# Patient Record
Sex: Female | Born: 1975 | Race: Black or African American | Hispanic: No | Marital: Single | Smoking: Never smoker
Health system: Southern US, Community
[De-identification: ages and names within clinical notes are randomized; demographics above are authoritative.]

## PROBLEM LIST (undated history)

## (undated) DIAGNOSIS — K5010 Crohn's disease of large intestine without complications: Secondary | ICD-10-CM

## (undated) HISTORY — PX: BOWEL RESECTION: SHX1257

---

## 1998-08-19 ENCOUNTER — Other Ambulatory Visit: Admission: RE | Admit: 1998-08-19 | Discharge: 1998-08-19 | Payer: Self-pay | Admitting: Internal Medicine

## 1998-12-08 ENCOUNTER — Other Ambulatory Visit: Admission: RE | Admit: 1998-12-08 | Discharge: 1998-12-08 | Payer: Self-pay | Admitting: Internal Medicine

## 1999-08-19 ENCOUNTER — Emergency Department (HOSPITAL_COMMUNITY): Admission: EM | Admit: 1999-08-19 | Discharge: 1999-08-19 | Payer: Self-pay | Admitting: Emergency Medicine

## 1999-11-23 HISTORY — PX: TUBAL LIGATION: SHX77

## 1999-12-08 ENCOUNTER — Emergency Department (HOSPITAL_COMMUNITY): Admission: EM | Admit: 1999-12-08 | Discharge: 1999-12-08 | Payer: Self-pay | Admitting: Emergency Medicine

## 2000-02-16 ENCOUNTER — Ambulatory Visit (HOSPITAL_COMMUNITY): Admission: RE | Admit: 2000-02-16 | Discharge: 2000-02-16 | Payer: Self-pay | Admitting: *Deleted

## 2000-03-02 ENCOUNTER — Encounter: Admission: RE | Admit: 2000-03-02 | Discharge: 2000-03-02 | Payer: Self-pay | Admitting: Obstetrics & Gynecology

## 2000-03-16 ENCOUNTER — Encounter: Admission: RE | Admit: 2000-03-16 | Discharge: 2000-03-16 | Payer: Self-pay | Admitting: Obstetrics & Gynecology

## 2000-03-30 ENCOUNTER — Encounter: Admission: RE | Admit: 2000-03-30 | Discharge: 2000-03-30 | Payer: Self-pay | Admitting: Obstetrics & Gynecology

## 2000-04-06 ENCOUNTER — Ambulatory Visit (HOSPITAL_COMMUNITY): Admission: RE | Admit: 2000-04-06 | Discharge: 2000-04-06 | Payer: Self-pay | Admitting: Obstetrics & Gynecology

## 2000-04-13 ENCOUNTER — Encounter: Admission: RE | Admit: 2000-04-13 | Discharge: 2000-04-13 | Payer: Self-pay | Admitting: Obstetrics & Gynecology

## 2000-04-20 ENCOUNTER — Encounter: Admission: RE | Admit: 2000-04-20 | Discharge: 2000-04-20 | Payer: Self-pay | Admitting: Obstetrics & Gynecology

## 2000-04-22 ENCOUNTER — Inpatient Hospital Stay (HOSPITAL_COMMUNITY): Admission: AD | Admit: 2000-04-22 | Discharge: 2000-04-22 | Payer: Self-pay | Admitting: *Deleted

## 2000-04-27 ENCOUNTER — Ambulatory Visit (HOSPITAL_COMMUNITY): Admission: RE | Admit: 2000-04-27 | Discharge: 2000-04-27 | Payer: Self-pay | Admitting: Obstetrics & Gynecology

## 2000-05-03 ENCOUNTER — Inpatient Hospital Stay (HOSPITAL_COMMUNITY): Admission: AD | Admit: 2000-05-03 | Discharge: 2000-05-03 | Payer: Self-pay | Admitting: *Deleted

## 2000-05-11 ENCOUNTER — Encounter: Admission: RE | Admit: 2000-05-11 | Discharge: 2000-05-11 | Payer: Self-pay | Admitting: Obstetrics

## 2000-05-18 ENCOUNTER — Encounter: Admission: RE | Admit: 2000-05-18 | Discharge: 2000-05-18 | Payer: Self-pay | Admitting: Obstetrics

## 2000-05-18 ENCOUNTER — Ambulatory Visit (HOSPITAL_COMMUNITY): Admission: RE | Admit: 2000-05-18 | Discharge: 2000-05-18 | Payer: Self-pay | Admitting: Obstetrics & Gynecology

## 2000-06-01 ENCOUNTER — Encounter: Admission: RE | Admit: 2000-06-01 | Discharge: 2000-06-01 | Payer: Self-pay | Admitting: Obstetrics

## 2000-06-08 ENCOUNTER — Ambulatory Visit (HOSPITAL_COMMUNITY): Admission: RE | Admit: 2000-06-08 | Discharge: 2000-06-08 | Payer: Self-pay | Admitting: Obstetrics & Gynecology

## 2000-06-15 ENCOUNTER — Inpatient Hospital Stay (HOSPITAL_COMMUNITY): Admission: AD | Admit: 2000-06-15 | Discharge: 2000-06-15 | Payer: Self-pay | Admitting: *Deleted

## 2000-06-15 ENCOUNTER — Encounter: Payer: Self-pay | Admitting: *Deleted

## 2000-06-22 ENCOUNTER — Encounter: Admission: RE | Admit: 2000-06-22 | Discharge: 2000-06-22 | Payer: Self-pay | Admitting: Obstetrics

## 2000-06-28 ENCOUNTER — Encounter (HOSPITAL_COMMUNITY): Admission: RE | Admit: 2000-06-28 | Discharge: 2000-07-08 | Payer: Self-pay | Admitting: *Deleted

## 2000-07-07 ENCOUNTER — Inpatient Hospital Stay (HOSPITAL_COMMUNITY): Admission: AD | Admit: 2000-07-07 | Discharge: 2000-07-09 | Payer: Self-pay | Admitting: Obstetrics

## 2000-07-08 ENCOUNTER — Encounter (INDEPENDENT_AMBULATORY_CARE_PROVIDER_SITE_OTHER): Payer: Self-pay

## 2001-06-29 ENCOUNTER — Other Ambulatory Visit: Admission: RE | Admit: 2001-06-29 | Discharge: 2001-06-29 | Payer: Self-pay | Admitting: Obstetrics and Gynecology

## 2002-04-04 ENCOUNTER — Other Ambulatory Visit: Admission: RE | Admit: 2002-04-04 | Discharge: 2002-04-04 | Payer: Self-pay | Admitting: Obstetrics & Gynecology

## 2004-09-21 ENCOUNTER — Emergency Department (HOSPITAL_COMMUNITY): Admission: EM | Admit: 2004-09-21 | Discharge: 2004-09-21 | Payer: Self-pay | Admitting: Family Medicine

## 2006-08-26 ENCOUNTER — Encounter: Admission: RE | Admit: 2006-08-26 | Discharge: 2006-08-26 | Payer: Self-pay | Admitting: Nephrology

## 2007-01-09 ENCOUNTER — Emergency Department (HOSPITAL_COMMUNITY): Admission: EM | Admit: 2007-01-09 | Discharge: 2007-01-09 | Payer: Self-pay | Admitting: Emergency Medicine

## 2007-02-19 ENCOUNTER — Emergency Department (HOSPITAL_COMMUNITY): Admission: EM | Admit: 2007-02-19 | Discharge: 2007-02-19 | Payer: Self-pay | Admitting: Family Medicine

## 2008-01-03 ENCOUNTER — Emergency Department (HOSPITAL_COMMUNITY): Admission: EM | Admit: 2008-01-03 | Discharge: 2008-01-03 | Payer: Self-pay | Admitting: Family Medicine

## 2008-03-15 ENCOUNTER — Emergency Department (HOSPITAL_COMMUNITY): Admission: EM | Admit: 2008-03-15 | Discharge: 2008-03-15 | Payer: Self-pay | Admitting: Family Medicine

## 2008-04-10 ENCOUNTER — Encounter: Admission: RE | Admit: 2008-04-10 | Discharge: 2008-04-10 | Payer: Self-pay | Admitting: Family Medicine

## 2008-05-15 ENCOUNTER — Encounter (INDEPENDENT_AMBULATORY_CARE_PROVIDER_SITE_OTHER): Payer: Self-pay | Admitting: *Deleted

## 2008-05-15 ENCOUNTER — Ambulatory Visit (HOSPITAL_COMMUNITY): Admission: RE | Admit: 2008-05-15 | Discharge: 2008-05-15 | Payer: Self-pay | Admitting: *Deleted

## 2009-01-23 ENCOUNTER — Emergency Department (HOSPITAL_COMMUNITY): Admission: EM | Admit: 2009-01-23 | Discharge: 2009-01-23 | Payer: Self-pay | Admitting: Family Medicine

## 2009-03-04 ENCOUNTER — Other Ambulatory Visit: Admission: RE | Admit: 2009-03-04 | Discharge: 2009-03-04 | Payer: Self-pay | Admitting: Pediatrics

## 2010-08-26 ENCOUNTER — Encounter: Admission: RE | Admit: 2010-08-26 | Discharge: 2010-08-26 | Payer: Self-pay | Admitting: Gastroenterology

## 2011-01-15 IMAGING — CT CT ENTEROGRAPHY (ABD-PELV W/ CM)
2 of 5 series · 12 of 36 positions shown, 19 images · IV contrast (agent unspecified)
Comparison: None.

CLINICAL DATA: History of Crohn's disease, recent abdominal
discomfort with bloating and nausea, history of prior bowel
resection in 3002 with appendectomy.

CT ABDOMEN AND PELVIS WITH CONTRAST (CT ENTEROGRAPHY)
TECHNIQUE: Multidetector CT of the abdomen and pelvis during bolus
administration of intravenous contrast. Negative oral contrast
VoLumen was given.
Contrast: 100 ml 2mnipaque-KEE intravenously and 6570 ml VoLumen
orally.

[Series 3: enterography (id) · axial · 0.74mm/px · z∈[-401,-18]mm · 11 of 183 slices shown, 17 images]
[im 16/183  soft-tissue]
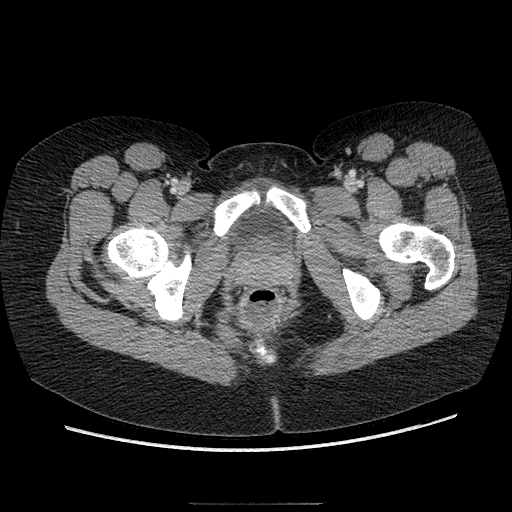
[im 16/183  bone]
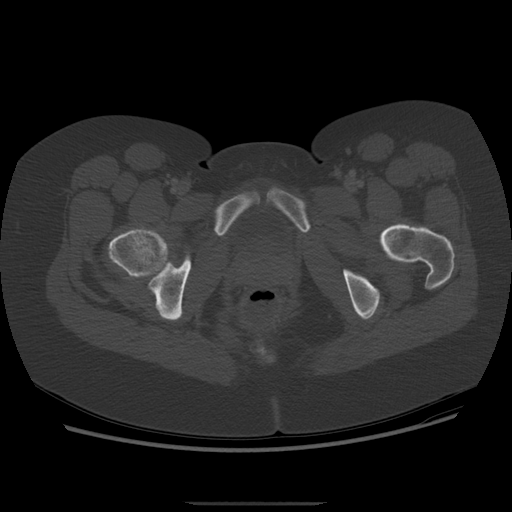
[im 31/183  soft-tissue]
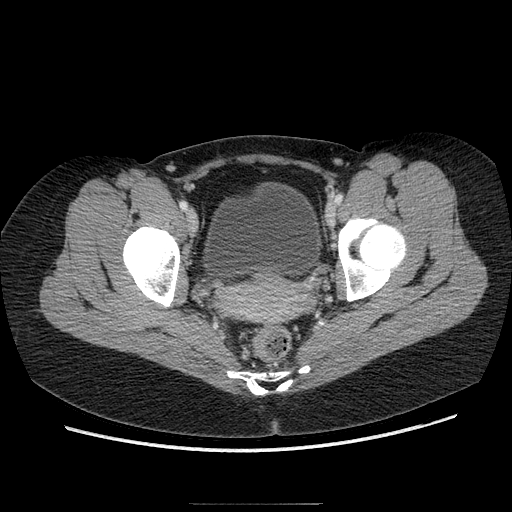
[im 46/183  soft-tissue]
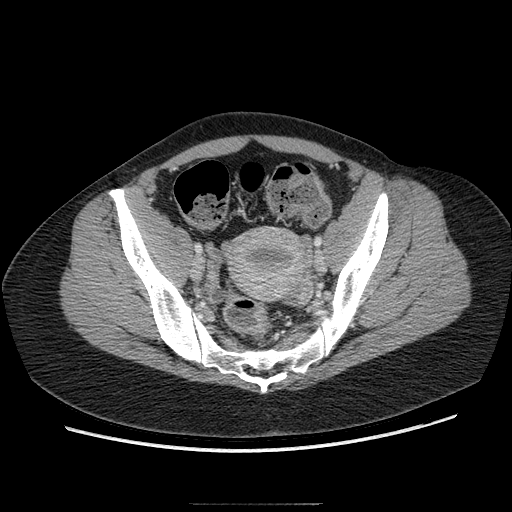
[im 61/183  soft-tissue]
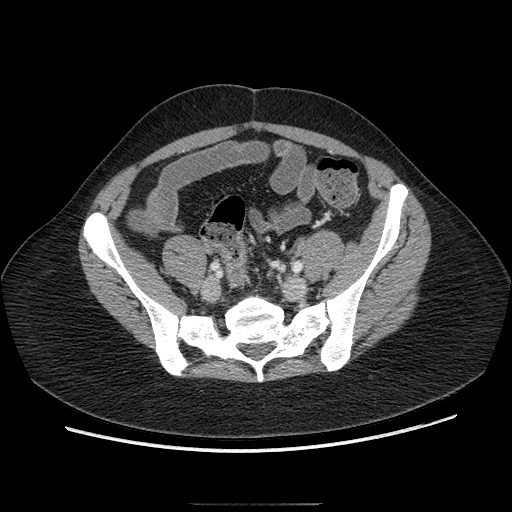
[im 76/183  soft-tissue]
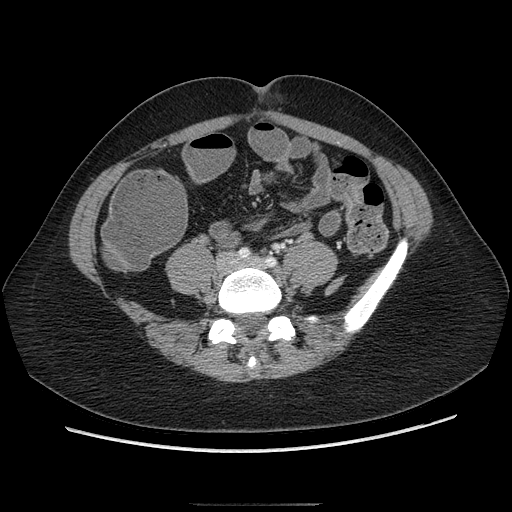
[im 92/183  soft-tissue]
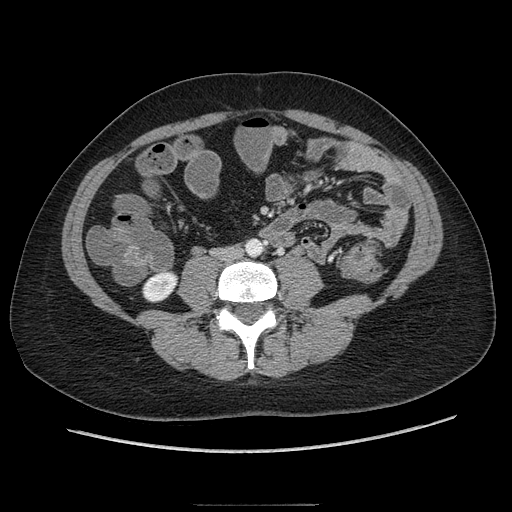
[im 107/183  soft-tissue]
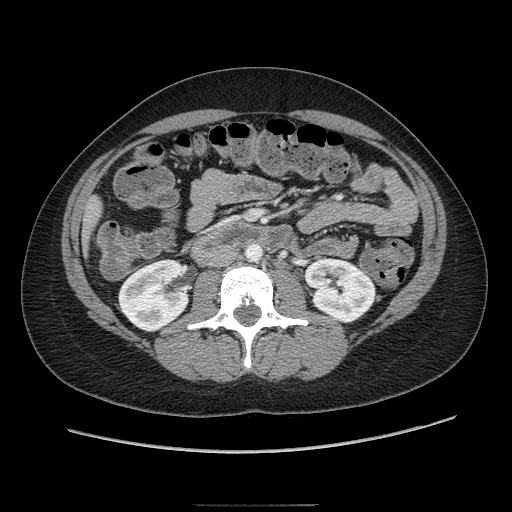
[im 122/183  soft-tissue]
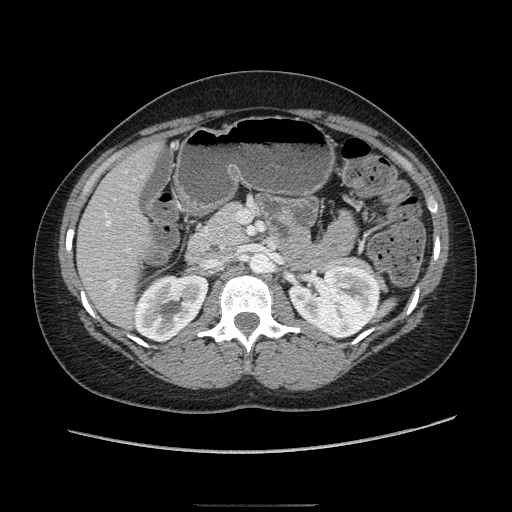
[im 122/183  lung]
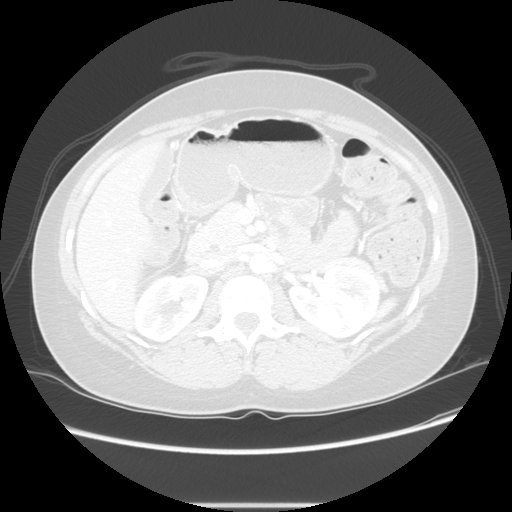
[im 137/183  soft-tissue]
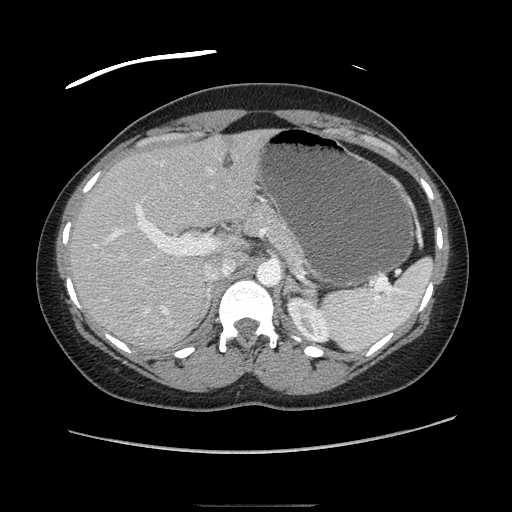
[im 137/183  lung]
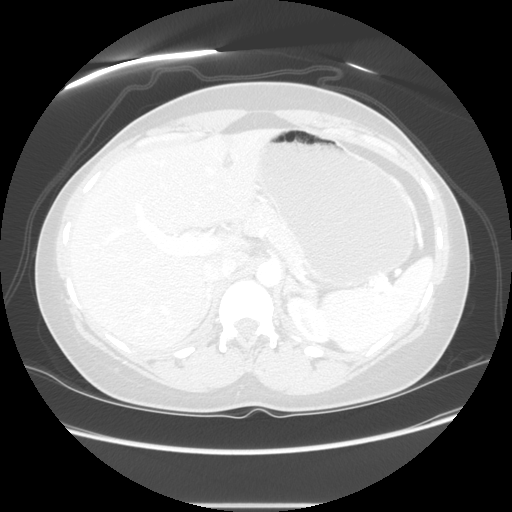
[im 137/183  bone]
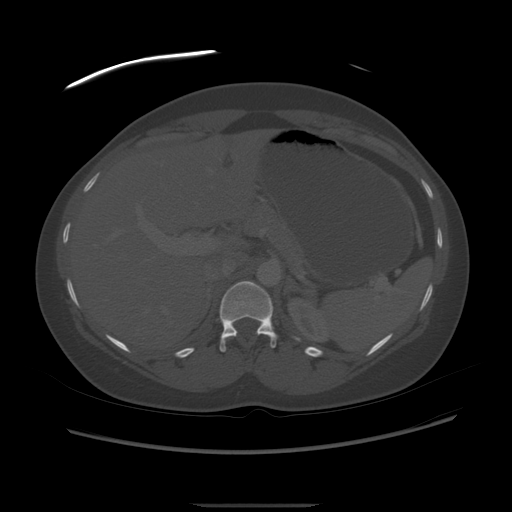
[im 152/183  soft-tissue]
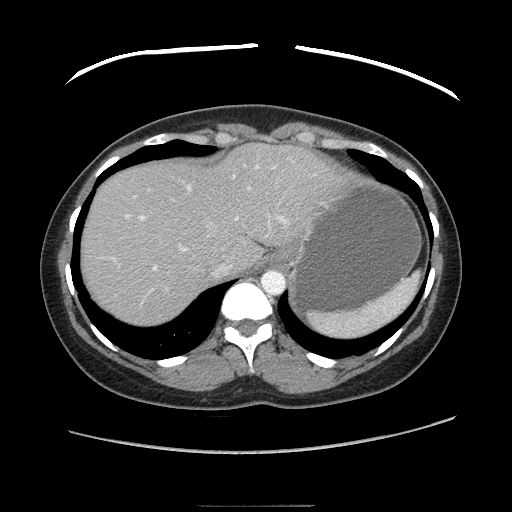
[im 152/183  lung]
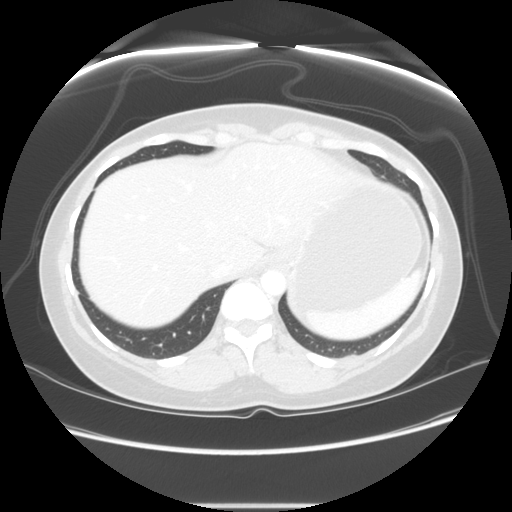
[im 167/183  soft-tissue]
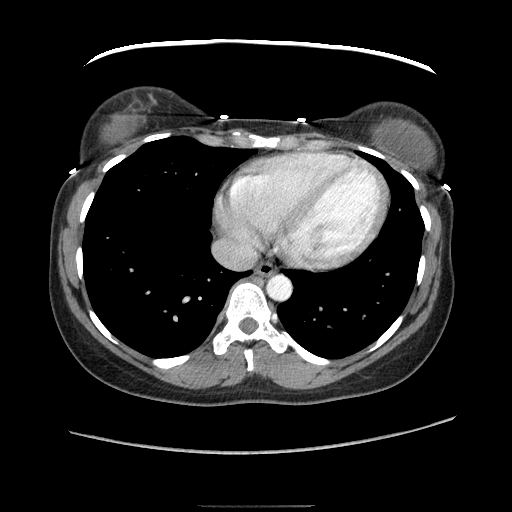
[im 167/183  lung]
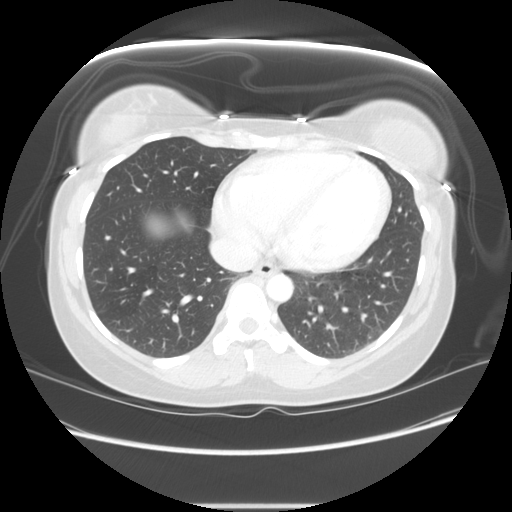

[Series 601: coronal body · coronal · 1.00mm/px · 1 of 119 slices shown, 2 images]
[im 40/119  soft-tissue]
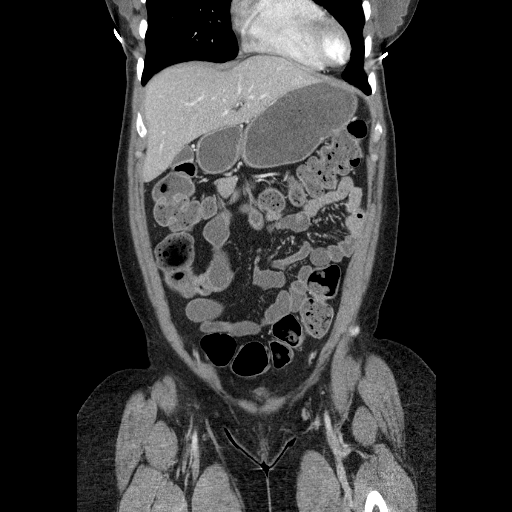
[im 40/119  bone]
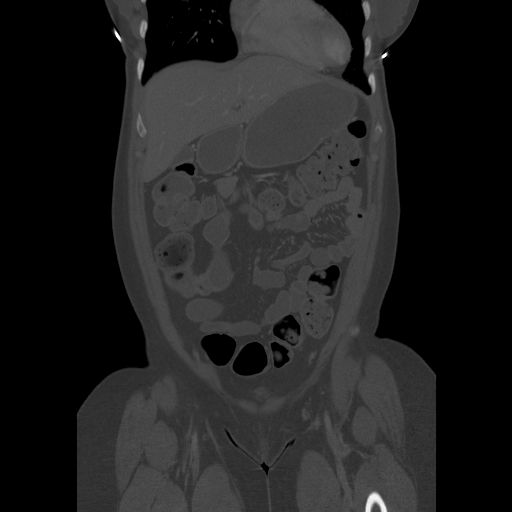

[12 of 36 positions shown; findings below may reference images not displayed]

FINDINGS: The lung bases are clear.  The liver enhances with no
focal abnormality and no ductal dilatation is seen.  No calcified
gallstones are noted.  The pancreas is normal in size and the
pancreatic duct is not dilated.  The adrenal glands and spleen are
unremarkable.  The stomach is fluid distended and unremarkable.
The kidneys enhance with no calculus or mass and no hydronephrosis
is seen.  Abdominal aorta is normal in caliber.

The uterus is normal in size.  There are bilateral ovarian
follicles with a probable collapsing left ovarian cyst and some
surrounding fluid.  The urinary bladder is unremarkable. The
anastomosis of small bowel and right colon is noted in the right
lower quadrant and appears widely patent.  Much of the small bowel
is well visualized with negative enteric contrast.  There is no
evidence of edema of small bowel or large bowel and no enhancing
bowel wall is evident.  A few scattered mesenteric nodes are
present, none of which are pathologically enlarged.  No bony
abnormality is seen.
IMPRESSION: 1.  No evidence of small or large bowel mucosal edema or
enhancement.
2.  The anastomosis of distal small bowel with right colon in the
right lower quadrant is well visualized and appears patent.
3.  Probable collapsing left ovarian cyst with small amount of
fluid adjacent.

## 2011-04-06 NOTE — Op Note (Signed)
NAME:  Angela Warren, Angela Warren              ACCOUNT NO.:  [id number]   MEDICAL RECORD NO.:  [id number]          PATIENT TYPE:  AMB   LOCATION:  ENDO                         FACILITY:  [facility]   PHYSICIAN:  Georgiana Spinner, M.D.    DATE OF BIRTH:  Sep 20, 1976   DATE OF PROCEDURE:  05/15/2008  DATE OF DISCHARGE:                               OPERATIVE REPORT   PROCEDURE:  Colonoscopy.   INDICATIONS:  Crohn disease.   ANESTHESIA:  Demerol 100 mg, Versed 10 mg, Benadryl 25 mg.   PROCEDURE:  With the patient mildly sedated in the left lateral  decubitus position, the Pentax videoscopic colonoscope was inserted in  the rectum and passed under direct vision to the neo-cecum, which was  photographed and appeared mildly inflamed and was biopsied.  From this  point the colonoscope was slowly withdrawn taking circumferential views  of colonic mucosa, stopping only in the rectum, which appeared normal on  direct and showed hemorrhoids on retroflexed view.  The endoscope was  straightened and withdrawn.  The patient's vital signs and pulse  oximetry remained stable.  The patient tolerated the procedure well  without apparent complications.   FINDINGS:  1. Internal hemorrhoids.  2. Questionable inflamed neo-cecum, which appeared as mildly      edematous.   PLAN:  Await biopsy report.  The patient will call me for results and  follow up with me as needed as an outpatient.           ______________________________  Georgiana Spinner, M.D.     GMO/MEDQ  D:  05/15/2008  T:  05/15/2008  Job:  [id number]

## 2011-04-06 NOTE — Op Note (Signed)
Angela Warren, Angela Warren              ACCOUNT NO.:  [id number]   MEDICAL RECORD NO.:  [id number]          PATIENT TYPE:  AMB   LOCATION:  ENDO                         FACILITY:  [facility]   PHYSICIAN:  Georgiana Spinner, M.D.    DATE OF BIRTH:  1976-09-24   DATE OF PROCEDURE:  DATE OF DISCHARGE:                               OPERATIVE REPORT   THERE IS NO DICTATION FOR THIS JOB NUMBER.           ______________________________  Georgiana Spinner, M.D.     GMO/MEDQ  D:  05/15/2008  T:  05/15/2008  Job:  [id number]

## 2011-04-09 NOTE — Op Note (Signed)
[facility]  Patient:    Angela Warren, Angela Warren                     MRN: [id number] Proc. Date: 07/08/00 Adm. Date:  [id number] Disc. Date: [id number] Attending:  Michaelle Copas CC:         [facility] Teaching Service OB/GYN Office  Attention:  Jamey Reas, M.D.   Operative Report  PREOPERATIVE DIAGNOSIS:       Desires sterilization.  POSTOPERATIVE DIAGNOSIS:      Desires sterilization.  PROCEDURE:                    Bilateral partial salpingectomy ([place]                               technique).  SURGEON:                      Charles A. Clearance Coots, M.D.  ASSISTANT:                    Jamey Reas, M.D.  ANESTHESIA:                   General.  ESTIMATED BLOOD LOSS:         Negligible.  COMPLICATIONS:                None.  SPECIMENS:                    Approximately 2.0 cm segments of right and left fallopian tubes.  DESCRIPTION OF PROCEDURE:     The patient was brought to the operating room and after satisfactory general endotracheal anesthesia, the abdomen was prepped and draped in the usual sterile fashion.  A small inferior umbilical incision was made with the scalpel, that was deepened down to the fascia with the scalpel.  The fascia was grasped in the midline with two Kelly forceps and was cut transversely with curved Mayo scissors.  The excision was extended to the left and to the right with the curved Mayo scissors.  The peritoneum was grasped with hemostats and was incised with Metzenbaum scissors.  The right angle retractors were placed in the incision and the right fallopian tube was identified and was grasped with the Babcock clamp.  The tube was followed from the cornual end to the fimbriated end serially, and then grasped with Babcock clamps, and then was followed retrograde back to the isthmic area of the tube with the Babcock clamps.  A knuckle of tube beneath the Babcock clamp in the isthmic area of the  tube was doubly ligated with #1 plain catgut and a section of tube above the knot was excised with Metzenbaum scissors and submitted to pathology for evaluation.  There was no active bleeding from the tubal stumps.  It was therefore placed back in the normal anatomic position.  The same procedure was performed on the opposite side without complications.  The abdomen was then closed as follows:  The peritoneum and fascia were closed as one, with the continuous suture of #2-0 Vicryl.  The subcutaneous tissue was approximated with two interrupted sutures of #2-0 Vicryl.  The skin was approximated with subcuticular continuous suture of #4-0 Monocryl.  A sterile bandage is applied to the incision closure by the surgical technician.  Again, the needle, sponge, and instrument counts were  correct.  The patient tolerated the procedure well and was transported to the recovery room in satisfactory condition. DD:  07/08/00 TD:  07/09/00 Job: [id number]

## 2011-09-23 ENCOUNTER — Inpatient Hospital Stay (INDEPENDENT_AMBULATORY_CARE_PROVIDER_SITE_OTHER)
Admission: RE | Admit: 2011-09-23 | Discharge: 2011-09-23 | Disposition: A | Discharge status: Home / Self Care | Payer: PRIVATE HEALTH INSURANCE | Source: Ambulatory Visit | Attending: Family Medicine | Admitting: Family Medicine

## 2011-09-23 DIAGNOSIS — J019 Acute sinusitis, unspecified: Secondary | ICD-10-CM

## 2011-09-23 DIAGNOSIS — J40 Bronchitis, not specified as acute or chronic: Secondary | ICD-10-CM

## 2013-04-23 ENCOUNTER — Emergency Department (HOSPITAL_COMMUNITY): Admission: EM | Admit: 2013-04-23 | Discharge: 2013-04-23 | Discharge status: Absent without leave | Payer: Self-pay

## 2013-11-07 ENCOUNTER — Other Ambulatory Visit: Payer: Self-pay | Admitting: Gastroenterology

## 2018-02-27 ENCOUNTER — Ambulatory Visit
Admission: RE | Admit: 2018-02-27 | Discharge: 2018-02-27 | Disposition: A | Discharge status: Home / Self Care | Payer: PRIVATE HEALTH INSURANCE | Source: Ambulatory Visit | Attending: Family Medicine | Admitting: Family Medicine

## 2018-02-27 ENCOUNTER — Other Ambulatory Visit: Payer: Self-pay | Admitting: Family Medicine

## 2018-02-27 DIAGNOSIS — J705 Respiratory conditions due to smoke inhalation: Principal | ICD-10-CM

## 2018-02-27 DIAGNOSIS — T59811A Toxic effect of smoke, accidental (unintentional), initial encounter: Secondary | ICD-10-CM
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
## 2020-09-11 ENCOUNTER — Other Ambulatory Visit: Payer: Self-pay

## 2020-09-11 ENCOUNTER — Ambulatory Visit
Admission: EM | Admit: 2020-09-11 | Discharge: 2020-09-11 | Disposition: A | Discharge status: Home / Self Care | Payer: 59

## 2020-09-11 DIAGNOSIS — B084 Enteroviral vesicular stomatitis with exanthem: Secondary | ICD-10-CM

## 2020-09-11 HISTORY — DX: Crohn's disease of large intestine without complications: K50.10

## 2020-09-11 NOTE — ED Provider Notes (Signed)
[facility]    CSN: [id number] Arrival date & time: 09/11/20  1502      History   Chief Complaint Chief Complaint  Patient presents with  . Rash  . Sore Throat    HPI Angela Warren is a 44 y.o. female.   44 year old female comes in for 4 day history of rash to hand foot and mouth. Grandson with diagnosed HFMD. Has had 1 week history of symptoms. Still developing rashes. Had cough that resolved. Denies fevers. States work requires her to be seen prior to returning to work due to worries of being contagious.      Past Medical History:  Diagnosis Date  . Crohn's colitis (HCC)     There are no problems to display for this patient.   Past Surgical History:  Procedure Laterality Date  . BOWEL RESECTION    . TUBAL LIGATION  2001    OB History   No obstetric history on file.      Home Medications    Prior to Admission medications   Medication Sig Start Date End Date Taking? Authorizing Provider  amphetamine-dextroamphetamine (ADDERALL) 15 MG tablet Take 15 mg by mouth daily.   Yes [provider]  spironolactone (ALDACTONE) 100 MG tablet Take by mouth. 07/13/19  Yes [provider]    Family History Family History  Problem Relation Age of Onset  . Hypertension Mother     Social History Social History   Tobacco Use  . Smoking status: Never Smoker  . Smokeless tobacco: Never Used  Vaping Use  . Vaping Use: Never used  Substance Use Topics  . Alcohol use: Yes  . Drug use: Never     Allergies   Patient has no known allergies.   Review of Systems Review of Systems  Reason unable to perform ROS: See HPI as above.     Physical Exam Triage Vital Signs ED Triage Vitals  Enc Vitals Group     BP 09/11/20 1521 (!) 143/99     Pulse Rate 09/11/20 1521 91     Resp 09/11/20 1521 18     Temp 09/11/20 1521 98.2 F (36.8 C)     Temp Source 09/11/20 1521 Oral     SpO2 09/11/20 1521 98 %     Weight --      Height --        Head Circumference --      Peak Flow --      Pain Score 09/11/20 1517 1     Pain Loc --      Pain Edu? --      Excl. in GC? --    No data found.  Updated Vital Signs BP (!) 145/98 (BP Location: Left Arm)   Pulse 91   Temp 98.2 F (36.8 C) (Oral)   Resp 18   SpO2 98%   Physical Exam Constitutional:      General: She is not in acute distress.    Appearance: Normal appearance. She is well-developed. She is not ill-appearing, toxic-appearing or diaphoretic.  HENT:     Head: Normocephalic and atraumatic.     Mouth/Throat:     Mouth: Mucous membranes are moist.     Pharynx: Oropharynx is clear. Uvula midline.     Comments: Few herpangina seen to the tongue.  Eyes:     Conjunctiva/sclera: Conjunctivae normal.     Pupils: Pupils are equal, round, and reactive to light.  Cardiovascular:  Rate and Rhythm: Normal rate and regular rhythm.     Heart sounds: Normal heart sounds. No murmur heard.  No friction rub. No gallop.   Pulmonary:     Effort: Pulmonary effort is normal. No accessory muscle usage, prolonged expiration, respiratory distress or retractions.     Comments: Lungs clear to auscultation without adventitious lung sounds. Musculoskeletal:     Cervical back: Normal range of motion and neck supple.  Skin:    General: Skin is warm and dry.     Comments: Maculopapular rash to bilateral palms. Foot exam deferred.   Neurological:     General: No focal deficit present.     Mental Status: She is alert and oriented to person, place, and time.      UC Treatments / Results  Labs (all labs ordered are listed, but only abnormal results are displayed) Labs Reviewed - No data to display  EKG   Radiology No results found.  Procedures Procedures (including critical care time)  Medications Ordered in UC Medications - No data to display  Initial Impression / Assessment and Plan / UC Course  I have reviewed the triage vital signs and the nursing  notes.  Pertinent labs & imaging results that were available during my care of the patient were reviewed by me and considered in my medical decision making (see chart for details).    History and exam consistent with HFMD. No obvious pain. Rash can be itching at times that is controlled with antihistamine. Given still developing new rash, discussed can still be contagious. Continue supportive management. Return precautions given.  Final Clinical Impressions(s) / UC Diagnoses   Final diagnoses:  Hand, foot and mouth disease    ED Prescriptions    None     PDMP not reviewed this encounter.   Belinda Fisher, PA-C 09/11/20 1551
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
## 2020-09-11 NOTE — Discharge Instructions (Signed)
Continue symptomatic treatment. No longer contagious when you stop developing new rashes. Monitor for fever.

## 2022-08-17 ENCOUNTER — Ambulatory Visit (INDEPENDENT_AMBULATORY_CARE_PROVIDER_SITE_OTHER): Payer: 59

## 2022-08-17 ENCOUNTER — Ambulatory Visit (INDEPENDENT_AMBULATORY_CARE_PROVIDER_SITE_OTHER): Payer: No Typology Code available for payment source | Admitting: Podiatry

## 2022-08-17 DIAGNOSIS — L84 Corns and callosities: Secondary | ICD-10-CM | POA: Diagnosis not present

## 2022-08-17 DIAGNOSIS — M2042 Other hammer toe(s) (acquired), left foot: Secondary | ICD-10-CM

## 2022-08-17 DIAGNOSIS — M2041 Other hammer toe(s) (acquired), right foot: Secondary | ICD-10-CM

## 2022-08-17 DIAGNOSIS — M19072 Primary osteoarthritis, left ankle and foot: Secondary | ICD-10-CM

## 2022-08-17 NOTE — Progress Notes (Signed)
  Subjective:  Patient ID: Angela Warren, female    DOB: 08/26/1976,  MRN: [id number]  Chief Complaint  Patient presents with   Callouses      np left toe swollen and painful for weeks/ corn on bilateral fifth  toes  ( req Dr Britt Bottom)    46 y.o. female presents with the above complaint. History confirmed with patient.  Her mother is a patient of mine.  Her primary complaints are callus lesions on the outside of the little toes on both feet.  She also has significant pain in the fourth toe of the left foot this is been going on since earlier this summer.  Objective:  Physical Exam: warm, good capillary refill, no trophic changes or ulcerative lesions, normal DP and PT pulses, normal sensory exam, and adductovarus contracture of bilateral fifth toes with hyperkeratosis over the PIPJ, she has significant tenderness in the DIPJ of the left fourth toe  Radiographs: Multiple views x-ray of both feet: Adductovarus hammertoe contractures with prominent metatarsal head laterally on the fifth toes, the left fourth DIPJ is not well visualized and appears to be cystic in nature Assessment:   1. 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 Callus of foot      Plan:  Patient was evaluated and treated and all questions answered.  We discussed etiology and both surgical and nonsurgical treatment options of hammertoe deformities and hyperkeratosis overlying the fifth toes bilaterally.  We discussed arthroplasty with excision of the skin lesions.  So far nonsurgical treatment has not been successful for her she has tried creams and lotions and having pedicures to debride the lesions.  She is interested in surgical correction and she will discuss with her work on the timing for this I discussed with her she would need at least 2 weeks for skin healing before returning to work.  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  Its been going on for couple of months now.  I discussed with her I would give this at least 6 months to see if it can be alleviated.  We discussed symptomatic treatment of the pain and arthritic changes as well with corticosteroid injection as well.  She wished to proceed with this.  Following sterile prep with alcohol and iodine the left fourth PIPJ was injected with 10 mg of dexamethasone phosphate.  She tolerated as well as with a bandage.  Post care instructions were given.  I will see her back as needed for this  Return if symptoms worsen or fail to improve.

## 2022-11-01 ENCOUNTER — Ambulatory Visit (INDEPENDENT_AMBULATORY_CARE_PROVIDER_SITE_OTHER): Payer: No Typology Code available for payment source | Admitting: Podiatry

## 2022-11-01 DIAGNOSIS — M2042 Other hammer toe(s) (acquired), left foot: Secondary | ICD-10-CM

## 2022-11-01 DIAGNOSIS — M2041 Other hammer toe(s) (acquired), right foot: Secondary | ICD-10-CM | POA: Diagnosis not present

## 2022-11-02 ENCOUNTER — Telehealth: Payer: Self-pay | Admitting: Podiatry

## 2022-11-02 NOTE — Progress Notes (Signed)
  Subjective:  Patient ID: Angela Warren, female    DOB: 11-17-1976,  MRN: [id number]  Chief Complaint  Patient presents with   Angela Warren    Surgical consult    46 y.o. female presents with the above complaint. History confirmed with patient.  She returns for follow-up of the bilateral fifth toes are still very painful.  The corns have returned.  She is thickening of the right great toe Objective:  Physical Exam: warm, good capillary refill, no trophic changes or ulcerative lesions, normal DP and PT pulses, normal sensory exam, and adductovarus contracture of bilateral fifth toes with hyperkeratosis over the PIPJ, she has thickening and dystrophy of the hallux nail on the right  Radiographs: Multiple views x-ray of both feet: Adductovarus hammertoe contractures with prominent metatarsal head laterally on the fifth toes, the left fourth DIPJ is not well visualized and appears to be cystic in nature Assessment:   1. Hammertoes of both feet      Plan:  Patient was evaluated and treated and all questions answered.  Discussed etiology of options of onychodystrophy do not expect this is fungus but took a nail culture to evaluate this further.  Regarding her fifth toes and PIPJ corns, she has not improved despite nonsurgical treatment with wider shoes and offloading pads and creams.  We discussed treatment of this including surgical correction.  Surgically we discussed arthroplasty of the fifth PIPJ with excision of the skin lesion.  We discussed the risk benefits symptoms complications including not limited to  pain, swelling, infection, scar, numbness which may be temporary or permanent, chronic pain, stiffness, nerve pain or damage, wound healing problems.  She wishes to proceed despite this.  Informed consent was signed and reviewed.  All questions were addressed.  No guarantees of outcome of surgery were able to be made   Surgical plan:  Procedure: -Bilateral fifth toe  arthroplasty  Location: -[facility]  Anesthesia plan: -IV sedation with local  Postoperative pain plan: - Tylenol 1000 mg every 6 hours, ibuprofen 600 mg every 6 hours, gabapentin 300 mg every 8 hours x5 days, oxycodone 5 mg 1-2 tabs every 6 hours only as needed  DVT prophylaxis: -None required  WB Restrictions / DME needs: -WBAT in bilateral surgical shoes dispensed today    No follow-ups on file.

## 2022-11-02 NOTE — Telephone Encounter (Signed)
Called the number given to me by pt and that I found online for Simone Curia, NP to get a referral for pt's secondary insurance [employer]. Received a recording a left a message that I was trying to get in touch with NP Simone Curia regarding a mutual patient and that I needed to obtain a referral from them. Asked to call me back directly at [phone].

## 2022-11-02 NOTE — Telephone Encounter (Signed)
DOS: 11/05/2022  [employer]  [employer]  Hamm[employer]oe [employer] 5th B/L [phone])  [employer] Benefits: Deductible: $600 with $154.32 remaining Out-of-Pocket: $3,000 with $2,492.36 remaining CoInsurance: 10%  Prior authorization is not required per Keisha P. Call Reference #: [id number]  [employer] Benefits: Deductible: $0 Out-of-Pocket: $3,000 with $ CoInsurance: 10%  Referral is required from PCP per Kelly Splinter Call reference #: [id number]  Prior authorization is not required per Jacinto Halim. Call Reference #: [id number]

## 2022-11-02 NOTE — Telephone Encounter (Signed)
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 I asked the pt to email me a copy of her [place] insurance card so I can call them after calling [employer] to see if prior authorization is required for her upcoming surgery on Friday.

## 2022-11-02 NOTE — Telephone Encounter (Signed)
Called pt to let her know that per [facility], a referral is required from her PCP. Pts PCP is now Simone Curia, NP and phone number is [phone].

## 2022-11-03 NOTE — Telephone Encounter (Signed)
Called and spoke to the front office person at [employer], NP's office. Stated that pt is having surgery on Friday and her insurance is requiring a referral. She asked we fax something over so that NP Samuel Bouche can review and fax a referral over. Their fax number is [phone]

## 2022-11-04 ENCOUNTER — Other Ambulatory Visit: Payer: Self-pay

## 2022-11-04 DIAGNOSIS — L603 Nail dystrophy: Secondary | ICD-10-CM

## 2022-11-05 ENCOUNTER — Encounter: Payer: Self-pay | Admitting: Podiatry

## 2022-11-05 DIAGNOSIS — M2042 Other hammer toe(s) (acquired), left foot: Secondary | ICD-10-CM | POA: Diagnosis not present

## 2022-11-05 DIAGNOSIS — M2041 Other hammer toe(s) (acquired), right foot: Secondary | ICD-10-CM | POA: Diagnosis not present

## 2022-11-05 MED ORDER — ACETAMINOPHEN 500 MG PO TABS: 1000.0000 mg | ORAL_TABLET | Freq: Four times a day (QID) | ORAL | 0 refills | Status: AC | PRN

## 2022-11-05 MED ORDER — IBUPROFEN 600 MG PO TABS: 600.0000 mg | ORAL_TABLET | Freq: Four times a day (QID) | ORAL | 0 refills | Status: AC | PRN

## 2022-11-05 MED ORDER — TRAMADOL HCL 50 MG PO TABS: 50.0000 mg | ORAL_TABLET | Freq: Four times a day (QID) | ORAL | 0 refills | Status: AC | PRN

## 2022-11-08 ENCOUNTER — Ambulatory Visit: Payer: 59 | Admitting: Podiatry

## 2022-11-11 ENCOUNTER — Ambulatory Visit (INDEPENDENT_AMBULATORY_CARE_PROVIDER_SITE_OTHER): Payer: No Typology Code available for payment source

## 2022-11-11 ENCOUNTER — Ambulatory Visit (INDEPENDENT_AMBULATORY_CARE_PROVIDER_SITE_OTHER): Payer: No Typology Code available for payment source | Admitting: Podiatry

## 2022-11-11 DIAGNOSIS — M2042 Other hammer toe(s) (acquired), left foot: Secondary | ICD-10-CM

## 2022-11-11 DIAGNOSIS — M2041 Other hammer toe(s) (acquired), right foot: Secondary | ICD-10-CM

## 2022-11-11 NOTE — Progress Notes (Signed)
  Subjective:  Patient ID: Angela Warren, female    DOB: [birth year],  MRN: [id number]  Chief Complaint  Patient presents with   Routine Post Op    (xray)POV #1 DOS 11/05/2022 HAMMERTOE CORRECTION OF BOTH 5TH TOE     46 y.o. female returns for post-op check.  Doing well not having much pain  Review of Systems: Negative except as noted in the HPI. Denies N/V/F/Ch.   Objective:  There were no vitals filed for this visit. There is no height or weight on file to calculate BMI. Constitutional Well developed. Well nourished.  Vascular Foot warm and well perfused. Capillary refill normal to all digits.  Calf is soft and supple, no posterior calf or knee pain, negative Homans' sign  Neurologic Normal speech. Oriented to person, place, and time. Epicritic sensation to light touch grossly present bilaterally.  Dermatologic Skin healing well without signs of infection. Skin edges well coapted without signs of infection.  Orthopedic: Tenderness to palpation noted about the surgical site.   Multiple view plain film radiographs: Status post fifth toe arthroplasty Assessment:   1. Hammertoes of both feet    Plan:  Patient was evaluated and treated and all questions answered.  S/p foot surgery bilaterally -Progressing as expected post-operatively. -XR: Noted above -WB Status: WBAT in surgical shoe until sutures out -Sutures: Plan remove in 2 weeks. -Medications: No refills required -Foot redressed.  She may remove dressing on Monday and shower  Return in about 2 weeks (around 11/25/2022) for suture removal.

## 2022-11-25 ENCOUNTER — Other Ambulatory Visit: Payer: 59

## 2022-11-29 ENCOUNTER — Ambulatory Visit (INDEPENDENT_AMBULATORY_CARE_PROVIDER_SITE_OTHER): Payer: No Typology Code available for payment source | Admitting: Podiatry

## 2022-11-29 DIAGNOSIS — M2041 Other hammer toe(s) (acquired), right foot: Secondary | ICD-10-CM

## 2022-11-29 DIAGNOSIS — M2042 Other hammer toe(s) (acquired), left foot: Secondary | ICD-10-CM

## 2022-11-29 DIAGNOSIS — L603 Nail dystrophy: Secondary | ICD-10-CM

## 2022-11-29 NOTE — Progress Notes (Signed)
  Subjective:  Patient ID: Angela Warren, female    DOB: [birth year],  MRN: [id number]  Chief Complaint  Patient presents with   Routine Post Op    Suture removal POV #2 DOS 11/05/2022 HAMMERTOE CORRECTION OF BOTH 5TH TOE     47 y.o. female returns for post-op check.  Overall she is doing okay she still having some pain in the right  Review of Systems: Negative except as noted in the HPI. Denies N/V/F/Ch.   Objective:  There were no vitals filed for this visit. There is no height or weight on file to calculate BMI. Constitutional Well developed. Well nourished.  Vascular Foot warm and well perfused. Capillary refill normal to all digits.  Calf is soft and supple, no posterior calf or knee pain, negative Homans' sign  Neurologic Normal speech. Oriented to person, place, and time. Epicritic sensation to light touch grossly present bilaterally.  Dermatologic Incisions well-healed no signs of infection  Orthopedic: She has little to no tenderness to palpation noted about the surgical site.   Multiple view plain film radiographs: Status post fifth toe arthroplasty Assessment:   1. Hammertoes of both feet   2. Nail dystrophy     Plan:  Patient was evaluated and treated and all questions answered.  S/p foot surgery bilaterally -Doing well sutures removed today uneventfully.  She may return to regular shoe gear.  We also reviewed the results of her nail biopsy which did not show fungus and only nail dystrophy.  She will follow-up with me as needed at this point I discussed with her that some burning tingling pain and swelling will take some more time to resolve fully.  Return if symptoms worsen or fail to improve.

## 2022-12-09 ENCOUNTER — Telehealth: Payer: Self-pay | Admitting: *Deleted

## 2022-12-09 NOTE — Telephone Encounter (Signed)
Patient is calling to request a note for work,needing more time off until Monday, not able to return yet, unable to get her shoes on because of pain and swelling of the right post surgery toe. Please advise.

## 2022-12-10 NOTE — Telephone Encounter (Signed)
Left voicemail for the patient stating that letter is ready for pick up at the front desk.

## 2022-12-16 ENCOUNTER — Encounter: Payer: 59 | Admitting: Podiatry

## 2022-12-16 ENCOUNTER — Telehealth: Payer: Self-pay | Admitting: Podiatry

## 2022-12-16 ENCOUNTER — Encounter: Payer: Self-pay | Admitting: Podiatry

## 2022-12-16 NOTE — Telephone Encounter (Signed)
Patient is still unable to put her shoes on and would like to extend her leave until 12/26/2022. Is that ok

## 2023-04-12 ENCOUNTER — Ambulatory Visit
Admission: EM | Admit: 2023-04-12 | Discharge: 2023-04-12 | Disposition: A | Discharge status: Home / Self Care | Payer: No Typology Code available for payment source | Attending: Family Medicine | Admitting: Family Medicine

## 2023-04-12 DIAGNOSIS — M25551 Pain in right hip: Secondary | ICD-10-CM

## 2023-04-12 DIAGNOSIS — M545 Low back pain, unspecified: Secondary | ICD-10-CM | POA: Diagnosis not present

## 2023-04-12 MED ORDER — SPIRONOLACTONE 100 MG PO TABS: 100.0000 mg | ORAL_TABLET | Freq: Every day | ORAL | 0 refills | Status: AC

## 2023-04-12 MED ORDER — METHYLPREDNISOLONE 4 MG PO TBPK: ORAL_TABLET | ORAL | 0 refills | Status: AC

## 2023-04-12 MED ORDER — CYCLOBENZAPRINE HCL 10 MG PO TABS: 5.0000 mg | ORAL_TABLET | Freq: Two times a day (BID) | ORAL | 0 refills | Status: AC | PRN

## 2023-04-12 NOTE — ED Triage Notes (Signed)
Here for "Back Pain, Right lower and extending to right hip" due to fall this morning. DOI: [id number] around 630am "I slipped and fell down stairs at home". No LOC. No abrasions/scrapes.

## 2023-04-12 NOTE — Discharge Instructions (Signed)
You were seen today for back and hip pain after a fall.  I do not think anything is broken, so I do not think xrays are needed.  I have sent out a steroid pack and muscle relaxer to your pharmacy.  This could make you tire/drowsy to take the muscle relaxer when home and no driving.  I recommend heat/ice to the back and hip to help. You may continue motrin 600-800mg  (taken with food) to help with pain.  If not improving then please return for further evaluation.

## 2023-04-12 NOTE — ED Provider Notes (Signed)
[facility]    CSN: [id number] Arrival date & time: 04/12/23  1342      History   Chief Complaint Chief Complaint  Patient presents with   Back Pain    HPI ZOANNE RILES is a 47 y.o. female.   Patient is here for back pain.  This morning she fell down her stairs.  She already has arthritis and bursitis of the hips, and this worsened.  Having pain at the right hip and lower back.  She did take motrin 800mg  with some help.  No numbness or tingling into the leg.   She is out of her spironolactone and would like a refill.  Has an apt with her pcp coming up.        Past Medical History:  Diagnosis Date   Crohn's colitis (HCC)     There are no problems to display for this patient.   Past Surgical History:  Procedure Laterality Date   BOWEL RESECTION     TUBAL LIGATION  2001    OB History   No obstetric history on file.      Home Medications    Prior to Admission medications   Medication Sig Start Date End Date Taking? Authorizing Provider  azithromycin (ZITHROMAX) 250 MG tablet TAKE 2 TABLETS (500 MG) BY ORAL ROUTE ONCE DAILY FOR 1 DAY THEN 1 TABLET (250 MG) BY ORAL ROUTE ONCE DAILY FOR 4 DAYS 01/12/23  Yes [provider]  benzonatate (TESSALON) 200 MG capsule Take 1 capsule 3 times a day by oral route as needed for 21 days. 01/12/23  Yes [provider]  betamethasone dipropionate 0.05 % lotion Apply to scalp 3 times per week. 04/24/18  Yes [provider]  BIOTIN PO 1 capsule Orally Once a day for 30 day(s) 01/25/17  Yes [provider]  buPROPion (WELLBUTRIN XL) 150 MG 24 hr tablet Take 150 mg by mouth every morning. 04/09/23  Yes [provider]  buPROPion (WELLBUTRIN) 100 MG tablet Take 100 mg by mouth daily. 04/08/23  Yes [provider]  desvenlafaxine (PRISTIQ) 100 MG 24 hr tablet Take 100 mg by mouth daily.   Yes [provider]  Fluocinolone Acetonide Scalp 0.01 % OIL Apply to  scalp 3 times weekly 04/24/18  Yes [provider]  lisdexamfetamine (VYVANSE) 30 MG capsule Take 30 mg by mouth daily. 11/08/22  Yes [provider]  lisdexamfetamine (VYVANSE) 50 MG capsule Take 50 mg by mouth daily.   Yes [provider]  LORazepam (ATIVAN) 0.5 MG tablet Take by mouth.   Yes [provider]  meloxicam (MOBIC) 15 MG tablet Take 1 tablet every day by oral route as needed for 30 days. 09/15/22  Yes [provider]  Menthol, Topical Analgesic, (BIOFREEZE PROFESSIONAL) 5 % GEL Apply 1 mL 4 times a day by topical route as needed. 09/15/22  Yes [provider]  spironolactone (ALDACTONE) 100 MG tablet Take by mouth. 07/13/19  Yes [provider]  traMADol (ULTRAM) 50 MG tablet Take 50 mg by mouth. 09/15/22  Yes [provider]  amphetamine-dextroamphetamine (ADDERALL) 15 MG tablet Take 15 mg by mouth daily.    [provider]  desvenlafaxine (PRISTIQ) 50 MG 24 hr tablet Take 50 mg by mouth daily.    [provider]  Methylphenidate HCl ER 54 MG TB24     [provider]  oseltamivir (TAMIFLU) 75 MG capsule     [provider]  predniSONE (DELTASONE) 20  MG tablet     [provider]  spironolactone (ALDACTONE) 100 MG tablet Take by mouth. 07/13/19   [provider]  valACYclovir (VALTREX) 1000 MG tablet     [provider]    Family History Family History  Problem Relation Age of Onset   Hypertension Mother     Social History Social History   Tobacco Use   Smoking status: Never   Smokeless tobacco: Never  Vaping Use   Vaping Use: Never used  Substance Use Topics   Alcohol use: Yes    Comment: Occassionally.   Drug use: Never     Allergies   Patient has no known allergies.   Review of Systems Review of Systems  Constitutional: Negative.   HENT: Negative.    Respiratory: Negative.    Cardiovascular: Negative.   Gastrointestinal:  Negative.   Genitourinary: Negative.   Musculoskeletal:  Positive for back pain.  Psychiatric/Behavioral: Negative.       Physical Exam Triage Vital Signs ED Triage Vitals  Enc Vitals Group     BP 04/12/23 1403 (!) 134/95     Pulse Rate 04/12/23 1403 (!) 103     Resp 04/12/23 1403 18     Temp 04/12/23 1403 97.9 F (36.6 C)     Temp Source 04/12/23 1403 Oral     SpO2 04/12/23 1403 98 %     Weight 04/12/23 1359 188 lb (85.3 kg)     Height 04/12/23 1359 5\' 8"  (1.727 m)     Head Circumference --      Peak Flow --      Pain Score 04/12/23 1358 5     Pain Loc --      Pain Edu? --      Excl. in GC? --    No data found.  Updated Vital Signs BP (!) 133/95 (BP Location: Left Arm)   Pulse (!) 108   Temp 97.9 F (36.6 C) (Oral)   Resp 18   Ht 5\' 8"  (1.727 m)   Wt 85.3 kg   LMP  (Exact Date)   SpO2 99%   BMI 28.59 kg/m   Visual Acuity Right Eye Distance:   Left Eye Distance:   Bilateral Distance:    Right Eye Near:   Left Eye Near:    Bilateral Near:     Physical Exam Constitutional:      General: She is not in acute distress.    Appearance: Normal appearance. She is ill-appearing.  Musculoskeletal:     Comments: +TTP to the lumbar spine;  slight TTP to the right lower back/hip area;  very TTP to the lateral right hip (mild to the left);  full rom of the right hip, with pain with full flexion/rotation;  normal strength noted  Skin:    General: Skin is warm.  Neurological:     General: No focal deficit present.     Mental Status: She is alert.  Psychiatric:        Mood and Affect: Mood normal.      UC Treatments / Results  Labs (all labs ordered are listed, but only abnormal results are displayed) Labs Reviewed - No data to display  EKG   Radiology No results found.  Procedures Procedures (including critical care time)  Medications Ordered in UC Medications - No data to display  Initial Impression / Assessment and Plan / UC Course  I have  reviewed the triage vital signs and the nursing notes.  Pertinent labs &  imaging results that were available during my care of the patient were reviewed by me and considered in my medical decision making (see chart for details).  Patient seen today for hip and back pain after a fall down the stairs. She has chronic back/hip pain.  She has full rom without worrisome findings.  We do not have xray available today, nor do I think she needs this.  I have sent out medications for her today.  Advised to follow up if not improving.   Final Clinical Impressions(s) / UC Diagnoses   Final diagnoses:  Acute midline low back pain without sciatica  Right hip pain     Discharge Instructions      You were seen today for back and hip pain after a fall.  I do not think anything is broken, so I do not think xrays are needed.  I have sent out a steroid pack and muscle relaxer to your pharmacy.  This could make you tire/drowsy to take the muscle relaxer when home and no driving.  I recommend heat/ice to the back and hip to help. You may continue motrin 600-800mg  (taken with food) to help with pain.  If not improving then please return for further evaluation.      ED Prescriptions     Medication Sig Dispense Auth. Provider   spironolactone (ALDACTONE) 100 MG tablet Take 1 tablet (100 mg total) by mouth daily. 30 tablet Geralynn Capri, MD   methylPREDNISolone (MEDROL DOSEPAK) 4 MG TBPK tablet Take as directed 1 each Gordy Goar, MD   cyclobenzaprine (FLEXERIL) 10 MG tablet Take 0.5-1 tablets (5-10 mg total) by mouth 2 (two) times daily as needed for muscle spasms. 20 tablet Jannifer Franklin, MD      PDMP not reviewed this encounter.   Jannifer Franklin, MD 04/12/23 [phone]

## 2023-09-19 ENCOUNTER — Encounter: Payer: Self-pay | Admitting: Neurology
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
## 2024-02-07 ENCOUNTER — Ambulatory Visit: Payer: Self-pay | Admitting: Neurology

## 2024-10-23 ENCOUNTER — Encounter (HOSPITAL_COMMUNITY): Payer: Self-pay | Admitting: Emergency Medicine

## 2024-10-23 ENCOUNTER — Emergency Department (HOSPITAL_COMMUNITY)

## 2024-10-23 ENCOUNTER — Emergency Department (HOSPITAL_COMMUNITY): Admission: EM | Admit: 2024-10-23 | Discharge: 2024-10-23 | Disposition: A | Discharge status: Home / Self Care

## 2024-10-23 DIAGNOSIS — G43809 Other migraine, not intractable, without status migrainosus: Secondary | ICD-10-CM | POA: Insufficient documentation

## 2024-10-23 DIAGNOSIS — R202 Paresthesia of skin: Secondary | ICD-10-CM | POA: Insufficient documentation

## 2024-10-23 LAB — BASIC METABOLIC PANEL WITH GFR
Anion gap: 9 (ref 5–15)
BUN: 10 mg/dL (ref 6–20)
CO2: 25 mmol/L (ref 22–32)
Calcium: 9.6 mg/dL (ref 8.9–10.3)
Chloride: 104 mmol/L (ref 98–111)
Creatinine, Ser: 0.86 mg/dL (ref 0.44–1.00)
GFR, Estimated: >60 mL/min (ref >60)
Glucose, Bld: 90 mg/dL (ref 70–99)
Potassium: 4.1 mmol/L (ref 3.5–5.1)
Sodium: 138 mmol/L (ref 135–145)

## 2024-10-23 LAB — CBC WITH DIFFERENTIAL/PLATELET
Abs Immature Granulocytes: 0.01 K/uL (ref 0.00–0.07)
Basophils Absolute: 0 K/uL (ref 0.0–0.1)
Basophils Relative: 0 %
Eosinophils Absolute: 0.1 K/uL (ref 0.0–0.5)
Eosinophils Relative: 1 %
HCT: 41.2 % (ref 36.0–46.0)
Hemoglobin: 13.5 g/dL (ref 12.0–15.0)
Immature Granulocytes: 0 %
Lymphocytes Relative: 27 %
Lymphs Abs: 2.1 K/uL (ref 0.7–4.0)
MCH: 29.9 pg (ref 26.0–34.0)
MCHC: 32.8 g/dL (ref 30.0–36.0)
MCV: 91.2 fL (ref 80.0–100.0)
Monocytes Absolute: 0.6 K/uL (ref 0.1–1.0)
Monocytes Relative: 7 %
Neutro Abs: 5 K/uL (ref 1.7–7.7)
Neutrophils Relative %: 65 %
Platelets: 390 K/uL (ref 150–400)
RBC: 4.52 MIL/uL (ref 3.87–5.11)
RDW: 13.1 % (ref 11.5–15.5)
WBC: 7.7 K/uL (ref 4.0–10.5)
nRBC: 0 % (ref 0.0–0.2)

## 2024-10-23 MED ORDER — LACTATED RINGERS IV BOLUS
1000.0000 mL | Freq: Once | INTRAVENOUS | Status: AC
  Administered 2024-10-23: 1000 mL via INTRAVENOUS

## 2024-10-23 MED ORDER — KETOROLAC TROMETHAMINE 15 MG/ML IJ SOLN
15.0000 mg | Freq: Once | INTRAMUSCULAR | Status: AC
  Administered 2024-10-23: 15 mg via INTRAVENOUS
  Filled 2024-10-23: qty 1

## 2024-10-23 NOTE — Discharge Instructions (Signed)
 You can take Tylenol  and Motrin  as needed for pain at home.  You can also try Excedrin Migraine as needed.  Follow-up with your primary care doctor.  Eat a balanced diet and drink lots of fluids.  Return to the ER for new or worsening symptoms.

## 2024-10-23 NOTE — ED Provider Notes (Signed)
 [place] EMERGENCY DEPARTMENT AT [facility] Provider Note   CSN: [id number] Arrival date & time: 10/23/24  1154     Patient presents with: Headache and Leg Pain (left)   Angela Warren is a 48 y.o. female.   48 year old female presents for evaluation of headache and left-sided pain.  She states on Saturday she developed some stumbling.  Does admit to drinking that night.  States she also had some tingling on her left side.  States that she has a headache and has felt very fatigued the last few days.  States her left side still feels funny.  She denies any pain or numbness or weakness at this time.  She is concerned she had a TIA.  States she also had a stressful few days.  Denies any other symptoms or concerns.   Headache Associated symptoms: no abdominal pain, no back pain, no cough, no ear pain, no eye pain, no fever, no seizures, no sore throat and no vomiting   Leg Pain Associated symptoms: no back pain and no fever        Prior to Admission medications   Medication Sig Start Date End Date Taking? Authorizing Provider  amphetamine-dextroamphetamine (ADDERALL) 15 MG tablet Take 15 mg by mouth daily.    [provider]  betamethasone dipropionate 0.05 % lotion Apply to scalp 3 times per week. 04/24/18   [provider]  BIOTIN PO 1 capsule Orally Once a day for 30 day(s) 01/25/17   [provider]  buPROPion (WELLBUTRIN XL) 150 MG 24 hr tablet Take 150 mg by mouth every morning. 04/09/23   [provider]  buPROPion (WELLBUTRIN) 100 MG tablet Take 100 mg by mouth daily. 04/08/23   [provider]  cyclobenzaprine  (FLEXERIL ) 10 MG tablet Take 0.5-1 tablets (5-10 mg total) by mouth 2 (two) times daily as needed for muscle spasms. 04/12/23   Piontek, Rocky, MD  desvenlafaxine (PRISTIQ) 100 MG 24 hr tablet Take 100 mg by mouth daily.    [provider]  desvenlafaxine (PRISTIQ) 50 MG 24 hr tablet Take 50 mg by mouth  daily.    [provider]  Fluocinolone Acetonide Scalp 0.01 % OIL Apply to scalp 3 times weekly 04/24/18   [provider]  lisdexamfetamine (VYVANSE) 30 MG capsule Take 30 mg by mouth daily. 11/08/22   [provider]  lisdexamfetamine (VYVANSE) 50 MG capsule Take 50 mg by mouth daily.    [provider]  LORazepam (ATIVAN) 0.5 MG tablet Take by mouth.    [provider]  meloxicam (MOBIC) 15 MG tablet Take 1 tablet every day by oral route as needed for 30 days. 09/15/22   [provider]  Menthol, Topical Analgesic, (BIOFREEZE PROFESSIONAL) 5 % GEL Apply 1 mL 4 times a day by topical route as needed. 09/15/22   [provider]  Methylphenidate HCl ER 54 MG TB24     [provider]  methylPREDNISolone  (MEDROL  DOSEPAK) 4 MG TBPK tablet Take as directed 04/12/23   Darral Rocky, MD  spironolactone  (ALDACTONE ) 100 MG tablet Take 1 tablet (100 mg total) by mouth daily. 04/12/23 05/12/23  Piontek, Rocky, MD  traMADol  (ULTRAM ) 50 MG tablet Take 50 mg by mouth. 09/15/22   [provider]  valACYclovir (VALTREX) 1000 MG tablet     [provider]    Allergies: Patient has no known allergies.    Review of Systems  Constitutional:  Negative for chills and fever.  HENT:  Negative for ear pain and sore throat.   Eyes:  Negative for pain and visual disturbance.  Respiratory:  Negative for cough and shortness of breath.   Cardiovascular:  Negative for chest pain and palpitations.  Gastrointestinal:  Negative for abdominal pain and vomiting.  Genitourinary:  Negative for dysuria and hematuria.  Musculoskeletal:  Negative for arthralgias and back pain.  Skin:  Negative for color change and rash.  Neurological:  Positive for headaches. Negative for seizures and syncope.  All other systems reviewed and are negative.   Updated Vital Signs BP 138/85 (BP Location: Left Arm)   Pulse 91   Temp 98.5 F (36.9 C) (Oral)    Resp 15   Ht 5' 8 (1.727 m)   Wt 78.9 kg   SpO2 100%   BMI 26.46 kg/m   Physical Exam Vitals and nursing note reviewed.  Constitutional:      General: She is not in acute distress.    Appearance: She is well-developed.  HENT:     Head: Normocephalic and atraumatic.  Eyes:     Conjunctiva/sclera: Conjunctivae normal.  Cardiovascular:     Rate and Rhythm: Normal rate and regular rhythm.     Heart sounds: No murmur heard. Pulmonary:     Effort: Pulmonary effort is normal. No respiratory distress.     Breath sounds: Normal breath sounds.  Abdominal:     Palpations: Abdomen is soft.     Tenderness: There is no abdominal tenderness.  Musculoskeletal:        General: No swelling.     Cervical back: Neck supple.  Skin:    General: Skin is warm and dry.     Capillary Refill: Capillary refill takes less than 2 seconds.  Neurological:     Mental Status: She is alert and oriented to person, place, and time.     Cranial Nerves: No cranial nerve deficit.     Sensory: No sensory deficit.     Motor: No weakness.     Comments: No ataxia, negative finger-to-nose and heel-to-shin bilaterally, muscle strength is 5 out of 5 throughout, there is no sensation deficit or facial droop, no pronator drift  Psychiatric:        Mood and Affect: Mood normal.     (all labs ordered are listed, but only abnormal results are displayed) Labs Reviewed  BASIC METABOLIC PANEL WITH GFR  CBC WITH DIFFERENTIAL/PLATELET    EKG: EKG Interpretation Date/Time:  Tuesday October 23 2024 13:58:54 EST Ventricular Rate:  91 PR Interval:  156 QRS Duration:  76 QT Interval:  345 QTC Calculation: 425 R Axis:   67  Text Interpretation: Sinus rhythm Consider left ventricular hypertrophy Nonspecific ST abnormalities Baseline wander in lead(s) V5 Compared with prior EKG from 12/08/1999 Confirmed by Gennaro Bouchard ([id number]) on 10/23/2024 2:07:38 PM  Radiology: CT Head Wo Contrast Result Date: 10/23/2024 EXAM: CT  HEAD WITHOUT CONTRAST 10/23/2024 02:50:00 PM TECHNIQUE: CT of the head was performed without the administration of intravenous contrast. Automated exposure control, iterative reconstruction, and/or weight based adjustment of the mA/kV was utilized to reduce the radiation dose to as low as reasonably achievable. COMPARISON: 04/10/2008. CLINICAL HISTORY: left weakness, headache FINDINGS: BRAIN AND VENTRICLES: No acute hemorrhage. No evidence of acute infarct. No hydrocephalus. No extra-axial collection. No mass effect or midline shift. ORBITS: No acute abnormality. SINUSES: No acute abnormality. SOFT TISSUES AND SKULL: No acute soft tissue abnormality. No skull fracture. IMPRESSION: 1. No acute intracranial abnormality. Electronically signed by: Franky Crease  MD 10/23/2024 03:13 PM EST RP Workstation: [id number]     Procedures   Medications Ordered in the ED  lactated ringers bolus 1,000 mL (0 mLs Intravenous Stopped 10/23/24 1640)  ketorolac (TORADOL) 15 MG/ML injection 15 mg (15 mg Intravenous Given 10/23/24 1532)                                    Medical Decision Making Patient here for headache and paresthesias that have just about resolved.  Given Toradol and fluids and her headache is much better.  CT head negative and lab workup unremarkable.  States she has been under much more stress lately 90s and this may be contributing to her symptoms.  Advised Tylenol  Motrin  as needed for pain or Excedrin Migraine as needed.  Advise close up with primary care and otherwise return to the ER for new or worsening symptoms.  She feels comfortable to plan be discharged home.  Problems Addressed: Other migraine without status migrainosus, not intractable: acute illness or injury Paresthesias: undiagnosed new problem with uncertain prognosis  Amount and/or Complexity of Data Reviewed External Data Reviewed: notes.    Details: Prior urgent care records reviewed and patient seen about a year ago for back  pain Labs: ordered. Decision-making details documented in ED Course.    Details: Ordered and reviewed by me and unremarkable Radiology: ordered and independent interpretation performed. Decision-making details documented in ED Course.    Details: Ordered and interpreted by me independently of radiology CT head: Shows no acute abnormality ECG/medicine tests: ordered and independent interpretation performed. Decision-making details documented in ED Course.    Details: Ordered and interpreted by me in the absence of cardiology shows sinus rhythm, no STEMI or significant change compared to prior  Risk OTC drugs. Prescription drug management. Drug therapy requiring intensive monitoring for toxicity.     Final diagnoses:  Other migraine without status migrainosus, not intractable  Paresthesias    ED Discharge Orders     None          Gennaro Duwaine CROME, DO 10/23/24 1713
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
